# Patient Record
Sex: Male | Born: 1960 | Race: White | Hispanic: No | Marital: Married | Smoking: Never smoker
Health system: Southern US, Community
[De-identification: ages and names within clinical notes are randomized; demographics above are authoritative.]

## PROBLEM LIST (undated history)

## (undated) DIAGNOSIS — N289 Disorder of kidney and ureter, unspecified: Secondary | ICD-10-CM

## (undated) HISTORY — PX: WRIST SURGERY: SHX841

## (undated) HISTORY — PX: ANKLE SURGERY: SHX546

---

## 2004-02-18 ENCOUNTER — Emergency Department (HOSPITAL_COMMUNITY): Admission: EM | Admit: 2004-02-18 | Discharge: 2004-02-18 | Payer: Self-pay | Admitting: Emergency Medicine

## 2004-02-28 ENCOUNTER — Ambulatory Visit (HOSPITAL_COMMUNITY): Admission: RE | Admit: 2004-02-28 | Discharge: 2004-02-28 | Payer: Self-pay | Admitting: Orthopedic Surgery

## 2004-07-10 ENCOUNTER — Emergency Department (HOSPITAL_COMMUNITY): Admission: EM | Admit: 2004-07-10 | Discharge: 2004-07-10 | Payer: Self-pay | Admitting: Emergency Medicine

## 2004-07-19 ENCOUNTER — Ambulatory Visit (HOSPITAL_BASED_OUTPATIENT_CLINIC_OR_DEPARTMENT_OTHER): Admission: RE | Admit: 2004-07-19 | Discharge: 2004-07-19 | Payer: Self-pay | Admitting: Orthopedic Surgery

## 2004-07-30 ENCOUNTER — Encounter: Admission: RE | Admit: 2004-07-30 | Discharge: 2004-10-28 | Payer: Self-pay | Admitting: Orthopedic Surgery

## 2004-09-17 ENCOUNTER — Encounter: Admission: RE | Admit: 2004-09-17 | Discharge: 2004-09-17 | Payer: Self-pay | Admitting: Orthopedic Surgery

## 2010-07-15 ENCOUNTER — Encounter: Payer: Self-pay | Admitting: Orthopedic Surgery

## 2012-05-08 ENCOUNTER — Emergency Department (HOSPITAL_COMMUNITY)
Admission: EM | Admit: 2012-05-08 | Discharge: 2012-05-08 | Disposition: A | Discharge status: Home / Self Care | Payer: Self-pay | Attending: Emergency Medicine | Admitting: Emergency Medicine

## 2012-05-08 ENCOUNTER — Encounter (HOSPITAL_COMMUNITY): Payer: Self-pay | Admitting: *Deleted

## 2012-05-08 DIAGNOSIS — IMO0002 Reserved for concepts with insufficient information to code with codable children: Secondary | ICD-10-CM | POA: Insufficient documentation

## 2012-05-08 DIAGNOSIS — T148XXA Other injury of unspecified body region, initial encounter: Secondary | ICD-10-CM

## 2012-05-08 DIAGNOSIS — Y9389 Activity, other specified: Secondary | ICD-10-CM | POA: Insufficient documentation

## 2012-05-08 DIAGNOSIS — M549 Dorsalgia, unspecified: Secondary | ICD-10-CM | POA: Insufficient documentation

## 2012-05-08 DIAGNOSIS — M542 Cervicalgia: Secondary | ICD-10-CM | POA: Insufficient documentation

## 2012-05-08 DIAGNOSIS — Z79899 Other long term (current) drug therapy: Secondary | ICD-10-CM | POA: Insufficient documentation

## 2012-05-08 MED ORDER — IBUPROFEN 800 MG PO TABS: 800.0000 mg | ORAL_TABLET | Freq: Four times a day (QID) | ORAL | Status: DC | PRN

## 2012-05-08 MED ORDER — OXYCODONE-ACETAMINOPHEN 5-325 MG PO TABS: 1.0000 | ORAL_TABLET | Freq: Four times a day (QID) | ORAL | Status: DC | PRN

## 2012-05-08 MED ORDER — DIAZEPAM 5 MG PO TABS: 5.0000 mg | ORAL_TABLET | Freq: Four times a day (QID) | ORAL | Status: DC | PRN
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
## 2016-08-18 ENCOUNTER — Emergency Department (HOSPITAL_COMMUNITY): Payer: Self-pay

## 2016-08-18 ENCOUNTER — Encounter (HOSPITAL_COMMUNITY): Payer: Self-pay

## 2016-08-18 ENCOUNTER — Emergency Department (HOSPITAL_COMMUNITY)
Admission: EM | Admit: 2016-08-18 | Discharge: 2016-08-18 | Disposition: A | Discharge status: Home / Self Care | Payer: Self-pay | Attending: Emergency Medicine | Admitting: Emergency Medicine

## 2016-08-18 DIAGNOSIS — Z79899 Other long term (current) drug therapy: Secondary | ICD-10-CM | POA: Insufficient documentation

## 2016-08-18 DIAGNOSIS — J069 Acute upper respiratory infection, unspecified: Secondary | ICD-10-CM | POA: Insufficient documentation

## 2016-08-18 LAB — CBG MONITORING, ED: GLUCOSE-CAPILLARY: 108 mg/dL — AB (ref 65–99)

## 2016-08-18 MED ORDER — METHOCARBAMOL 500 MG PO TABS: 500.0000 mg | ORAL_TABLET | Freq: Four times a day (QID) | ORAL | 0 refills | Status: DC | PRN

## 2016-08-18 MED ORDER — FLUTICASONE PROPIONATE 50 MCG/ACT NA SUSP: 2.0000 | Freq: Every day | NASAL | 0 refills | Status: DC

## 2016-08-18 MED ORDER — ALBUTEROL SULFATE HFA 108 (90 BASE) MCG/ACT IN AERS
2.0000 | INHALATION_SPRAY | Freq: Once | RESPIRATORY_TRACT | Status: AC
  Administered 2016-08-18: 2 via RESPIRATORY_TRACT
  Filled 2016-08-18: qty 6.7

## 2016-08-18 NOTE — ED Notes (Signed)
Bed: WA19 Expected date:  Expected time:  Means of arrival:  Comments: 

## 2016-08-18 NOTE — ED Provider Notes (Signed)
[facility]-EMERGENCY DEPT Provider Note   CSN: [id number] Arrival date & time: 08/18/16  7829     History   Chief Complaint Chief Complaint  Patient presents with  . Cough  . Nasal Congestion    HPI Charles Werner is a 56 y.o. male.  HPI   Patient presents with 4 days of cough, occasional SOB, nasal congestion, facial fullness and pain.  He also has pain in his upper back with coughing or sneezing.  Associated fatigue.  The symptoms come and go, worse at night.  The symptoms are not exertional.  Denies any fevers, chills, myalgias, current facial pain, sore throat, chest pain.  Has had sick contact at work.  No flu shot this season.  No known medical problems but does not go to a doctor.    History reviewed. No pertinent past medical history.  There are no active problems to display for this patient.   Past Surgical History:  Procedure Laterality Date  . ANKLE SURGERY    . WRIST SURGERY         Home Medications    Prior to Admission medications   Medication Sig Start Date End Date Taking? Authorizing Provider  diazepam (VALIUM) 5 MG tablet Take 1 tablet (5 mg total) by mouth every 6 (six) hours as needed (spasm). 05/08/12   Benjiman Core, MD  fluticasone (FLONASE) 50 MCG/ACT nasal spray Place 2 sprays into both nostrils daily. 08/18/16   Trixie Dredge, PA-C  ibuprofen (ADVIL,MOTRIN) 200 MG tablet Take 600 mg by mouth every 6 (six) hours as needed. For pain    Historical Provider, MD  ibuprofen (ADVIL,MOTRIN) 800 MG tablet Take 1 tablet (800 mg total) by mouth every 6 (six) hours as needed for pain. 05/08/12   Benjiman Core, MD  methocarbamol (ROBAXIN) 500 MG tablet Take 1-2 tablets (500-1,000 mg total) by mouth every 6 (six) hours as needed for muscle spasms (and pain). 08/18/16   Trixie Dredge, PA-C  oxyCODONE-acetaminophen (PERCOCET/ROXICET) 5-325 MG per tablet Take 1-2 tablets by mouth every 6 (six) hours as needed for pain. 05/08/12   Benjiman Core, MD    Family  History History reviewed. No pertinent family history.  Social History Social History  Substance Use Topics  . Smoking status: Never Smoker  . Smokeless tobacco: Never Used  . Alcohol use No     Allergies   Patient has no known allergies.   Review of Systems Review of Systems  All other systems reviewed and are negative.    Physical Exam Updated Vital Signs BP 133/91 (BP Location: Left Arm)   Pulse 103   Temp 97.6 F (36.4 C) (Oral)   Resp 18   SpO2 97%   Physical Exam  Constitutional: He appears well-developed and well-nourished. No distress.  HENT:  Head: Normocephalic and atraumatic.  Nose: Rhinorrhea present. Right sinus exhibits no maxillary sinus tenderness and no frontal sinus tenderness. Left sinus exhibits no maxillary sinus tenderness and no frontal sinus tenderness.  Mouth/Throat: Uvula is midline, oropharynx is clear and moist and mucous membranes are normal. No oropharyngeal exudate, posterior oropharyngeal edema, posterior oropharyngeal erythema or tonsillar abscesses.  Eyes: Conjunctivae and EOM are normal. Right eye exhibits no discharge. Left eye exhibits no discharge.  Neck: Normal range of motion. Neck supple.  Cardiovascular: Normal rate and regular rhythm.   Pulmonary/Chest: Effort normal and breath sounds normal. No stridor. No respiratory distress. He has no wheezes. He has no rales.  Lymphadenopathy:    He has no cervical adenopathy.  Neurological: He is alert.  Skin: He is not diaphoretic.  Nursing note and vitals reviewed.    ED Treatments / Results  Labs (all labs ordered are listed, but only abnormal results are displayed) Labs Reviewed  CBG MONITORING, ED - Abnormal; Notable for the following:       Result Value   Glucose-Capillary 108 (*)    All other components within normal limits    EKG  EKG Interpretation None       Radiology Dg Chest 2 View  Result Date: 08/18/2016 CLINICAL DATA:  Cough congestion and sneezing for  3 days. EXAM: CHEST  2 VIEW COMPARISON:  02/28/2004 chest radiograph FINDINGS: The cardiomediastinal silhouette is unremarkable. Mild elevation of the right hemidiaphragm again noted. There is no evidence of focal airspace disease, pulmonary edema, suspicious pulmonary nodule/mass, pleural effusion, or pneumothorax. No acute bony abnormalities are identified. IMPRESSION: No active cardiopulmonary disease. Electronically Signed   By: Harmon PierJeffrey  Hu M.D.   On: 08/18/2016 09:43    Procedures Procedures (including critical care time)  Medications Ordered in ED Medications  albuterol (PROVENTIL HFA;VENTOLIN HFA) 108 (90 Base) MCG/ACT inhaler 2 puff (not administered)     Initial Impression / Assessment and Plan / ED Course  I have reviewed the triage vital signs and the nursing notes.  Pertinent labs & imaging results that were available during my care of the patient were reviewed by me and considered in my medical decision making (see chart for details).     Afebrile, nontoxic patient with constellation of symptoms suggestive of viral syndrome.  No concerning findings on exam. Doubt acute bacterial infection.   CXR negative.  CBG 108, pt advised to follow up with PCP and have rechecked.  Discharged home with supportive care, PCP follow up.  Discussed result, findings, treatment, and follow up  with patient.  Pt given return precautions.  Pt verbalizes understanding and agrees with plan.      Final Clinical Impressions(s) / ED Diagnoses   Final diagnoses:  Viral upper respiratory tract infection    New Prescriptions New Prescriptions   FLUTICASONE (FLONASE) 50 MCG/ACT NASAL SPRAY    Place 2 sprays into both nostrils daily.   METHOCARBAMOL (ROBAXIN) 500 MG TABLET    Take 1-2 tablets (500-1,000 mg total) by mouth every 6 (six) hours as needed for muscle spasms (and pain).     Trixie Dredgemily Truly Stankiewicz, PA-C 08/18/16 1049    Rolland PorterMark James, MD 08/30/16 [phone]

## 2016-08-18 NOTE — ED Triage Notes (Signed)
Pt states cough, congestion, headache, runny nose and sneezing since Thursday.  Denies fever or n/v

## 2016-08-18 NOTE — Discharge Instructions (Signed)
Read the information below.  Use the prescribed medication as directed.  Please discuss all new medications with your pharmacist.  You may return to the Emergency Department at any time for worsening condition or any new symptoms that concern you.  If you develop high fevers that do not resolve with tylenol or ibuprofen, you have difficulty swallowing or breathing, develop chest pain, or you are unable to tolerate fluids by mouth, return to the ER for a recheck.    °

## 2021-05-18 ENCOUNTER — Encounter (HOSPITAL_COMMUNITY): Payer: Self-pay | Admitting: Emergency Medicine

## 2021-05-18 ENCOUNTER — Emergency Department (HOSPITAL_COMMUNITY)
Admission: EM | Admit: 2021-05-18 | Discharge: 2021-05-19 | Disposition: A | Discharge status: Home / Self Care | Payer: Self-pay | Attending: Emergency Medicine | Admitting: Emergency Medicine

## 2021-05-18 ENCOUNTER — Emergency Department (HOSPITAL_COMMUNITY): Payer: Self-pay

## 2021-05-18 ENCOUNTER — Other Ambulatory Visit: Payer: Self-pay

## 2021-05-18 DIAGNOSIS — J101 Influenza due to other identified influenza virus with other respiratory manifestations: Secondary | ICD-10-CM | POA: Insufficient documentation

## 2021-05-18 DIAGNOSIS — Z20822 Contact with and (suspected) exposure to covid-19: Secondary | ICD-10-CM | POA: Insufficient documentation

## 2021-05-18 DIAGNOSIS — Z28310 Unvaccinated for covid-19: Secondary | ICD-10-CM | POA: Insufficient documentation

## 2021-05-18 DIAGNOSIS — R079 Chest pain, unspecified: Secondary | ICD-10-CM

## 2021-05-18 LAB — BASIC METABOLIC PANEL
Anion gap: 11 (ref 5–15)
BUN: 11 mg/dL (ref 6–20)
CO2: 23 mmol/L (ref 22–32)
Calcium: 9.3 mg/dL (ref 8.9–10.3)
Chloride: 104 mmol/L (ref 98–111)
Creatinine, Ser: 0.95 mg/dL (ref 0.61–1.24)
GFR, Estimated: >60 mL/min (ref >60)
Glucose, Bld: 122 mg/dL — ABNORMAL HIGH (ref 70–99)
Potassium: 4 mmol/L (ref 3.5–5.1)
Sodium: 138 mmol/L (ref 135–145)

## 2021-05-18 LAB — CBC WITH DIFFERENTIAL/PLATELET
Abs Immature Granulocytes: 0.04 K/uL (ref 0.00–0.07)
Basophils Absolute: 0.1 K/uL (ref 0.0–0.1)
Basophils Relative: 1 %
Eosinophils Absolute: 0.4 K/uL (ref 0.0–0.5)
Eosinophils Relative: 4 %
HCT: 48.9 % (ref 39.0–52.0)
Hemoglobin: 15.8 g/dL (ref 13.0–17.0)
Immature Granulocytes: 0 %
Lymphocytes Relative: 14 %
Lymphs Abs: 1.3 K/uL (ref 0.7–4.0)
MCH: 30.5 pg (ref 26.0–34.0)
MCHC: 32.3 g/dL (ref 30.0–36.0)
MCV: 94.4 fL (ref 80.0–100.0)
Monocytes Absolute: 1 K/uL (ref 0.1–1.0)
Monocytes Relative: 10 %
Neutro Abs: 6.9 K/uL (ref 1.7–7.7)
Neutrophils Relative %: 71 %
Platelets: 188 K/uL (ref 150–400)
RBC: 5.18 MIL/uL (ref 4.22–5.81)
RDW: 13.1 % (ref 11.5–15.5)
WBC: 9.6 K/uL (ref 4.0–10.5)
nRBC: 0 % (ref 0.0–0.2)

## 2021-05-18 LAB — TROPONIN I (HIGH SENSITIVITY)
Troponin I (High Sensitivity): 3 ng/L (ref <18)
Troponin I (High Sensitivity): 3 ng/L

## 2021-05-18 LAB — RESP PANEL BY RT-PCR (FLU A&B, COVID) ARPGX2
Influenza A by PCR: POSITIVE — AB
Influenza B by PCR: NEGATIVE
SARS Coronavirus 2 by RT PCR: NEGATIVE

## 2021-05-18 MED ORDER — ONDANSETRON 4 MG PO TBDP
4.0000 mg | ORAL_TABLET | Freq: Once | ORAL | Status: AC
  Administered 2021-05-18: 4 mg via ORAL
  Filled 2021-05-18: qty 1

## 2021-05-18 MED ORDER — OSELTAMIVIR PHOSPHATE 75 MG PO CAPS
75.0000 mg | ORAL_CAPSULE | Freq: Once | ORAL | Status: AC
  Administered 2021-05-18: 75 mg via ORAL
  Filled 2021-05-18: qty 1

## 2021-05-18 MED ORDER — KETOROLAC TROMETHAMINE 30 MG/ML IJ SOLN
30.0000 mg | Freq: Once | INTRAMUSCULAR | Status: AC
  Administered 2021-05-18: 30 mg via INTRAVENOUS
  Filled 2021-05-18: qty 1

## 2021-05-18 NOTE — Discharge Instructions (Signed)
You were seen in the emergency department for evaluation of sore throat cough chest pain headache shortness of breath.  You tested positive for flu.  You had a chest x-ray lab work and EKG that otherwise did not show any significant abnormalities.  We are prescribing you Tamiflu which may help shorten the symptoms appear flu.  Your COVID test was negative.  You can use Tylenol and ibuprofen for pain and fever.  Drink plenty of fluids.  Rest.  Follow-up with your doctor.  Return to the emergency department if any worsening or concerning symptoms

## 2021-05-18 NOTE — ED Triage Notes (Signed)
Pt c/o chest discomfort with back discomfort since this am, reports decreased appetite with n/v

## 2021-05-18 NOTE — ED Notes (Signed)
While about to draw blood patient had emesis x2. PA Britni made aware

## 2021-05-18 NOTE — ED Provider Notes (Signed)
[facility] DEPT Provider Note   CSN: [id number] Arrival date & time: 05/18/21  2018     History No chief complaint on file.   Charles Werner is a 60 y.o. male.  He is here with a complaint of nasal congestion chest congestion chest discomfort into his back, scratchy sore throat, chills that started yesterday.  Cough is nonproductive.  Symptoms are worse with activity.  Has tried nothing for it.  He is not flu or COVID vaccinated.  Had COVID twice this year.  The history is provided by the patient.  Influenza Presenting symptoms: cough, headache, rhinorrhea, shortness of breath and sore throat   Presenting symptoms: no diarrhea, no fever, no nausea and no vomiting   Shortness of breath:    Severity:  Moderate   Onset quality:  Gradual   Duration:  3 days   Timing:  Intermittent   Progression:  Unchanged Severity:  Mild Onset quality:  Gradual Duration:  2 days Progression:  Unchanged Relieved by:  None tried Worsened by:  Nothing Ineffective treatments:  None tried Associated symptoms: chills and nasal congestion   Associated symptoms: no mental status change   Chest Pain Pain location:  L chest and R chest Pain quality: pressure   Pain radiates to:  Upper back Pain severity:  Moderate Onset quality:  Gradual Duration:  2 days Timing:  Intermittent Progression:  Unchanged Chronicity:  New Relieved by:  None tried Worsened by:  Certain positions Ineffective treatments:  None tried Associated symptoms: cough, headache and shortness of breath   Associated symptoms: no abdominal pain, no diaphoresis, no fever, no nausea and no vomiting   Risk factors: no smoking       No past medical history on file.  There are no problems to display for this patient.   Past Surgical History:  Procedure Laterality Date   ANKLE SURGERY     WRIST SURGERY         No family history on file.  Social History   Tobacco Use   Smoking status:  Never   Smokeless tobacco: Never  Substance Use Topics   Alcohol use: No   Drug use: No    Home Medications Prior to Admission medications   Medication Sig Start Date End Date Taking? Authorizing Provider  diazepam (VALIUM) 5 MG tablet Take 1 tablet (5 mg total) by mouth every 6 (six) hours as needed (spasm). 05/08/12   Davonna Belling, MD  fluticasone (FLONASE) 50 MCG/ACT nasal spray Place 2 sprays into both nostrils daily. 08/18/16   Clayton Bibles, PA-C  ibuprofen (ADVIL,MOTRIN) 200 MG tablet Take 600 mg by mouth every 6 (six) hours as needed. For pain    [provider]  ibuprofen (ADVIL,MOTRIN) 800 MG tablet Take 1 tablet (800 mg total) by mouth every 6 (six) hours as needed for pain. 05/08/12   Davonna Belling, MD  methocarbamol (ROBAXIN) 500 MG tablet Take 1-2 tablets (500-1,000 mg total) by mouth every 6 (six) hours as needed for muscle spasms (and pain). 08/18/16   Clayton Bibles, PA-C  oxyCODONE-acetaminophen (PERCOCET/ROXICET) 5-325 MG per tablet Take 1-2 tablets by mouth every 6 (six) hours as needed for pain. 05/08/12   Davonna Belling, MD    Allergies    Patient has no known allergies.  Review of Systems   Review of Systems  Constitutional:  Positive for chills. Negative for diaphoresis and fever.  HENT:  Positive for congestion, rhinorrhea and sore throat.   Eyes:  Negative for  visual disturbance.  Respiratory:  Positive for cough and shortness of breath.   Cardiovascular:  Positive for chest pain.  Gastrointestinal:  Negative for abdominal pain, diarrhea, nausea and vomiting.  Genitourinary:  Negative for dysuria.  Musculoskeletal:  Negative for neck pain.  Skin:  Negative for rash.  Neurological:  Positive for headaches.   Physical Exam Updated Vital Signs BP (!) 154/96 (BP Location: Left Arm)   Pulse (!) 101   Temp 98.7 F (37.1 C) (Oral)   Resp 18   SpO2 94%   Physical Exam Vitals and nursing note reviewed.  Constitutional:      General: He is  not in acute distress.    Appearance: Normal appearance. He is well-developed.  HENT:     Head: Normocephalic and atraumatic.     Mouth/Throat:     Mouth: Mucous membranes are moist.     Pharynx: Oropharynx is clear.  Eyes:     Conjunctiva/sclera: Conjunctivae normal.  Cardiovascular:     Rate and Rhythm: Regular rhythm. Tachycardia present.     Heart sounds: No murmur heard. Pulmonary:     Effort: Pulmonary effort is normal. No respiratory distress.     Breath sounds: Normal breath sounds.  Abdominal:     Palpations: Abdomen is soft.     Tenderness: There is no abdominal tenderness. There is no guarding or rebound.  Musculoskeletal:        General: No swelling.     Cervical back: Neck supple.     Right lower leg: No edema.     Left lower leg: No edema.  Skin:    General: Skin is warm and dry.     Capillary Refill: Capillary refill takes less than 2 seconds.  Neurological:     General: No focal deficit present.     Mental Status: He is alert.     Sensory: No sensory deficit.     Motor: No weakness.  Psychiatric:        Mood and Affect: Mood normal.    ED Results / Procedures / Treatments   Labs (all labs ordered are listed, but only abnormal results are displayed) Labs Reviewed  RESP PANEL BY RT-PCR (FLU A&B, COVID) ARPGX2 - Abnormal; Notable for the following components:      Result Value   Influenza A by PCR POSITIVE (*)    All other components within normal limits  BASIC METABOLIC PANEL - Abnormal; Notable for the following components:   Glucose, Bld 122 (*)    All other components within normal limits  CBC WITH DIFFERENTIAL/PLATELET  TROPONIN I (HIGH SENSITIVITY)  TROPONIN I (HIGH SENSITIVITY)    EKG EKG Interpretation  Date/Time:  Friday May 18 2021 20:34:32 EST Ventricular Rate:  108 PR Interval:  132 QRS Duration: 94 QT Interval:  324 QTC Calculation: 435 R Axis:   50 Text Interpretation: Sinus tachycardia Borderline repolarization abnormality  Nonspecific ST and T wave abnormality Confirmed by Aletta Edouard [phone]) on 05/18/2021 8:51:56 PM  Radiology DG Chest Port 1 View  Result Date: 05/18/2021 CLINICAL DATA:  Cough, shortness of breath. EXAM: PORTABLE CHEST 1 VIEW COMPARISON:  08/18/2016. FINDINGS: The heart size and mediastinal contours are within normal limits. Both lungs are clear. The visualized skeletal structures are unremarkable. IMPRESSION: No acute cardiopulmonary process. Electronically Signed   By: Brett Fairy M.D.   On: 05/18/2021 21:31    Procedures Procedures   Medications Ordered in ED Medications  ondansetron (ZOFRAN-ODT) disintegrating tablet 4 mg (4 mg Oral  Given 05/18/21 2050)  ketorolac (TORADOL) 30 MG/ML injection 30 mg (30 mg Intravenous Given 05/18/21 2208)  oseltamivir (TAMIFLU) capsule 75 mg (75 mg Oral Given 05/18/21 2314)    ED Course  I have reviewed the triage vital signs and the nursing notes.  Pertinent labs & imaging results that were available during my care of the patient were reviewed by me and considered in my medical decision making (see chart for details).  Clinical Course as of 05/19/21 1025  Fri May 18, 2021  2128 Chest x-ray interpreted by me as no acute infiltrates.  Awaiting radiology reading. [MB]    Clinical Course User Index [MB] Terrilee Files, MD   MDM Rules/Calculators/A&P                          Jaevon Paras was evaluated in Emergency Department on 05/18/2021 for the symptoms described in the history of present illness. He was evaluated in the context of the global COVID-19 pandemic, which necessitated consideration that the patient might be at risk for infection with the SARS-CoV-2 virus that causes COVID-19. Institutional protocols and algorithms that pertain to the evaluation of patients at risk for COVID-19 are in a state of rapid change based on information released by regulatory bodies including the CDC and federal and state organizations. These policies  and algorithms were followed during the patient's care in the ED.  This patient complains of cough shortness of breath chest and back pain, headache; this involves an extensive number of treatment Options and is a complaint that carries with it a high risk of complications and Morbidity. The differential includes COVID, flu, pneumonia, ACS, PE, pneumonia pneumothorax  I ordered, reviewed and interpreted labs, which included CBC with normal white count normal hemoglobin, chemistries normal other than mildly elevated glucose, troponins flat, COVID-negative flu positive I ordered medication Toradol, Zofran, Tamiflu I ordered imaging studies which included chest x-ray and I independently    visualized and interpreted imaging which showed acute findings Previous records obtained and reviewed in epic no recent admissions  After the interventions stated above, I reevaluated the patient and found patient to be hemodynamically stable slightly hypertensive.  He is comfortable plan for outpatient management of his symptoms.  Return instructions discussed   Final Clinical Impression(s) / ED Diagnoses Final diagnoses:  Influenza A  Nonspecific chest pain    Rx / DC Orders ED Discharge Orders          Ordered    oseltamivir (TAMIFLU) 75 MG capsule  Every 12 hours        05/19/21 0014             Terrilee Files, MD 05/19/21 1027

## 2021-05-18 NOTE — ED Provider Notes (Signed)
Emergency Medicine Provider Triage Evaluation Note  Charles Werner , a 60 y.o. male  was evaluated in triage.  Pt complains of congestion, chest pain.  Noted yesterday had a scratchy throat.  Today he developed some substernal chest pain.  Does not radiate to left arm, left back.  No lower extremity swelling.  No history of PE or DVT.  No history of aneurysm.  Does have some mild cough which is nonproductive.  Review of Systems  Positive: Chest pain, scratchy throat Negative: Lower extremity edema  Physical Exam  BP (!) 154/96 (BP Location: Left Arm)   Pulse (!) 101   Temp 98.7 F (37.1 C) (Oral)   Resp 18   SpO2 94%  Gen:   Awake, no distress   Resp:  Normal effort  MSK:   Moves extremities without difficulty  Other:    Medical Decision Making  Medically screening exam initiated at 8:39 PM.  Appropriate orders placed.  Charles Werner was informed that the remainder of the evaluation will be completed by another provider, this initial triage assessment does not replace that evaluation, and the importance of remaining in the ED until their evaluation is complete.  Chest pain, sore throat   Maguire Killmer A, PA-C 05/18/21 2040    Terrilee Files, MD 05/19/21 1027

## 2021-05-19 ENCOUNTER — Telehealth (HOSPITAL_COMMUNITY): Payer: Self-pay | Admitting: Physician Assistant

## 2021-05-19 MED ORDER — OSELTAMIVIR PHOSPHATE 75 MG PO CAPS: 75.0000 mg | ORAL_CAPSULE | Freq: Two times a day (BID) | ORAL | 0 refills | Status: DC

## 2021-05-19 MED ORDER — OSELTAMIVIR PHOSPHATE 75 MG PO CAPS: 75.0000 mg | ORAL_CAPSULE | Freq: Two times a day (BID) | ORAL | 0 refills | Status: AC

## 2021-05-19 NOTE — Telephone Encounter (Signed)
Patient seen yesterday in ED by Dr. Charm Barges.  Diagnosed with Influenza A.  Tamiflu RX did not crossover to pharmacy.  Patient called today to resend in medication.  Per patient prefers [facility] [address] [place].   Rx sent.

## 2021-09-05 ENCOUNTER — Encounter (HOSPITAL_COMMUNITY): Payer: Self-pay

## 2021-09-05 ENCOUNTER — Emergency Department (HOSPITAL_COMMUNITY)
Admission: EM | Admit: 2021-09-05 | Discharge: 2021-09-05 | Disposition: A | Discharge status: Home / Self Care | Payer: Self-pay | Attending: Emergency Medicine | Admitting: Emergency Medicine

## 2021-09-05 ENCOUNTER — Emergency Department (HOSPITAL_COMMUNITY): Payer: Self-pay

## 2021-09-05 DIAGNOSIS — N132 Hydronephrosis with renal and ureteral calculous obstruction: Secondary | ICD-10-CM | POA: Insufficient documentation

## 2021-09-05 DIAGNOSIS — N200 Calculus of kidney: Secondary | ICD-10-CM

## 2021-09-05 LAB — URINALYSIS, ROUTINE W REFLEX MICROSCOPIC
Bilirubin Urine: NEGATIVE
Glucose, UA: NEGATIVE mg/dL
Ketones, ur: 15 mg/dL — AB
Leukocytes,Ua: NEGATIVE
Nitrite: NEGATIVE
Specific Gravity, Urine: 1.025 (ref 1.005–1.030)
pH: 6.5 (ref 5.0–8.0)

## 2021-09-05 LAB — URINALYSIS, MICROSCOPIC (REFLEX)

## 2021-09-05 MED ORDER — HYDROMORPHONE HCL 2 MG/ML IJ SOLN
2.0000 mg | Freq: Once | INTRAMUSCULAR | Status: AC
  Administered 2021-09-05: 2 mg via INTRAMUSCULAR
  Filled 2021-09-05: qty 1

## 2021-09-05 MED ORDER — ONDANSETRON 8 MG PO TBDP
8.0000 mg | ORAL_TABLET | Freq: Three times a day (TID) | ORAL | 0 refills | Status: AC | PRN
Start: 2021-09-05 — End: ?

## 2021-09-05 MED ORDER — ONDANSETRON 8 MG PO TBDP
8.0000 mg | ORAL_TABLET | Freq: Once | ORAL | Status: AC
  Administered 2021-09-05: 8 mg via ORAL
  Filled 2021-09-05: qty 1

## 2021-09-05 MED ORDER — HYDROMORPHONE HCL 1 MG/ML IJ SOLN
1.0000 mg | Freq: Once | INTRAMUSCULAR | Status: AC
  Administered 2021-09-05: 1 mg via INTRAMUSCULAR
  Filled 2021-09-05: qty 1

## 2021-09-05 MED ORDER — KETOROLAC TROMETHAMINE 60 MG/2ML IM SOLN
30.0000 mg | Freq: Once | INTRAMUSCULAR | Status: AC
  Administered 2021-09-05: 30 mg via INTRAMUSCULAR
  Filled 2021-09-05: qty 2

## 2021-09-05 MED ORDER — OXYCODONE-ACETAMINOPHEN 5-325 MG PO TABS: 1.0000 | ORAL_TABLET | Freq: Four times a day (QID) | ORAL | 0 refills | Status: AC | PRN

## 2021-09-05 NOTE — ED Provider Notes (Signed)
?[facility]-EMERGENCY DEPT ?Provider Note ? ? ?CSN: [id number] ?Arrival date & time: 09/05/21  1194 ? ?  ? ?History ? ?Chief Complaint  ?Patient presents with  ? Flank Pain  ? ? ?Charles Werner is a 61 y.o. male. ? ?61 year old male presents with cute onset of left-sided flank pain rating to left groin.  Has noted dark urine.  Denies any prior history of renal colic.  No fever or chills.  No testicular pain or swelling.  No treatment use prior to arrival ? ? ?  ? ?Home Medications ?Prior to Admission medications   ?Medication Sig Start Date End Date Taking? Authorizing Provider  ?oseltamivir (TAMIFLU) 75 MG capsule Take 1 capsule (75 mg total) by mouth every 12 (twelve) hours. 05/19/21   Henderly, Britni A, PA-C  ?   ? ?Allergies    ?Patient has no known allergies.   ? ?Review of Systems   ?Review of Systems  ?All other systems reviewed and are negative. ? ?Physical Exam ?Updated Vital Signs ?BP (!) 145/80 (BP Location: Left Arm)   Pulse 84   Temp (!) 97.4 ?F (36.3 ?C) (Oral)   Resp 18   SpO2 96%  ?Physical Exam ?Vitals and nursing note reviewed.  ?Constitutional:   ?   General: He is not in acute distress. ?   Appearance: Normal appearance. He is well-developed. He is not toxic-appearing.  ?HENT:  ?   Head: Normocephalic and atraumatic.  ?Eyes:  ?   General: Lids are normal.  ?   Conjunctiva/sclera: Conjunctivae normal.  ?   Pupils: Pupils are equal, round, and reactive to light.  ?Neck:  ?   Thyroid: No thyroid mass.  ?   Trachea: No tracheal deviation.  ?Cardiovascular:  ?   Rate and Rhythm: Normal rate and regular rhythm.  ?   Heart sounds: Normal heart sounds. No murmur heard. ?  No gallop.  ?Pulmonary:  ?   Effort: Pulmonary effort is normal. No respiratory distress.  ?   Breath sounds: Normal breath sounds. No stridor. No decreased breath sounds, wheezing, rhonchi or rales.  ?Abdominal:  ?   General: There is no distension.  ?   Palpations: Abdomen is soft.  ?   Tenderness: There is no  abdominal tenderness. There is left CVA tenderness. There is no rebound.  ?Musculoskeletal:     ?   General: No tenderness. Normal range of motion.  ?   Cervical back: Normal range of motion and neck supple.  ?Skin: ?   General: Skin is warm and dry.  ?   Findings: No abrasion or rash.  ?Neurological:  ?   Mental Status: He is alert and oriented to person, place, and time. Mental status is at baseline.  ?   GCS: GCS eye subscore is 4. GCS verbal subscore is 5. GCS motor subscore is 6.  ?   Cranial Nerves: No cranial nerve deficit.  ?   Sensory: No sensory deficit.  ?   Motor: Motor function is intact.  ?Psychiatric:     ?   Attention and Perception: Attention normal.     ?   Speech: Speech normal.     ?   Behavior: Behavior normal.  ? ? ?ED Results / Procedures / Treatments   ?Labs ?(all labs ordered are listed, but only abnormal results are displayed) ?Labs Reviewed  ?URINALYSIS, ROUTINE W REFLEX MICROSCOPIC  ? ? ?EKG ?None ? ?Radiology ?No results found. ? ?Procedures ?Procedures  ? ? ?Medications  Ordered in ED ?Medications  ?HYDROmorphone (DILAUDID) injection 2 mg (has no administration in time range)  ? ? ?ED Course/ Medical Decision Making/ A&P ?  ?                        ?Medical Decision Making ?Amount and/or Complexity of Data Reviewed ?Labs: ordered. ?Radiology: ordered. ? ?Risk ?Prescription drug management. ? ? ?Patient presented with cute onset of left-sided flank pain concern for renal colic.  Patient's CAT scan does show a 4 mm left-sided kidney stone.  Patient images were reviewed and interpreted by me.  He did require multiple doses of IM medication for pain management.  He does will be at this time for urinalysis without infection.  Patient will be discharged home and given referral to neurology on-call ? ? ? ? ? ? ? ?Final Clinical Impression(s) / ED Diagnoses ?Final diagnoses:  ?None  ? ? ?Rx / DC Orders ?ED Discharge Orders   ? ? None  ? ?  ? ? ?  ?Lorre Nick, MD ?09/05/21 1501 ? ?

## 2021-09-05 NOTE — ED Notes (Signed)
Informed pt we need a urine and if we don't get one we will have to do a cathter pt stated he will try on his own ?

## 2021-09-05 NOTE — ED Triage Notes (Signed)
Pt arrived via POV, c/o left flank and LLQ abd pain. Also endorses some nausea. Denies any vomiting, diarrhea or urinary issues.  ?

## 2021-11-25 ENCOUNTER — Emergency Department (HOSPITAL_COMMUNITY)
Admission: EM | Admit: 2021-11-25 | Discharge: 2021-11-25 | Disposition: A | Discharge status: Home / Self Care | Payer: Self-pay | Attending: Emergency Medicine | Admitting: Emergency Medicine

## 2021-11-25 ENCOUNTER — Encounter (HOSPITAL_COMMUNITY): Payer: Self-pay

## 2021-11-25 ENCOUNTER — Other Ambulatory Visit: Payer: Self-pay

## 2021-11-25 DIAGNOSIS — Z87442 Personal history of urinary calculi: Secondary | ICD-10-CM | POA: Insufficient documentation

## 2021-11-25 DIAGNOSIS — N23 Unspecified renal colic: Secondary | ICD-10-CM | POA: Insufficient documentation

## 2021-11-25 HISTORY — DX: Disorder of kidney and ureter, unspecified: N28.9

## 2021-11-25 NOTE — Discharge Instructions (Addendum)
If you develop fever, vomiting, new or worsening pain, persistent pain that does not go away, weakness or numbness in your legs, difficulty urinating, or any other new/concerning symptoms or return to the ER for evaluation.  If your pain does not seem to go away after several days or a week, then see the urologist as an outpatient.

## 2021-11-25 NOTE — ED Provider Notes (Signed)
[facility]-EMERGENCY DEPT Provider Note   CSN: [id number] Arrival date & time: 11/25/21  1126     History  Chief Complaint  Patient presents with   Flank Pain   Abdominal Pain    Charles Werner is a 61 y.o. male.  HPI 61 year old male presents with right sided flank and abdominal pain that seems consistent with previous history of kidney stones.  He states that he had a little bit of pain yesterday after heavy lifting.  However this morning he developed severe pain.  He states it felt just like when he had a kidney stone on the left side several weeks ago and was seen in this emergency department.  Finally he took a narcotic that he had been given last time but never ended up taking.  By the time he got here after he was encouraged to come here by family, his pain has resolved.  Since being in the waiting room for a couple hours he has not developed any recurrent pain.  He did vomit once earlier.  No fevers or dysuria.  No hematuria.  Currently has no pain.  No radicular pain down his legs or weakness/numbness.  Home Medications Prior to Admission medications   Medication Sig Start Date End Date Taking? Authorizing Provider  ondansetron (ZOFRAN-ODT) 8 MG disintegrating tablet Take 1 tablet (8 mg total) by mouth every 8 (eight) hours as needed for nausea or vomiting. 09/05/21   Lorre Nick, MD  oseltamivir (TAMIFLU) 75 MG capsule Take 1 capsule (75 mg total) by mouth every 12 (twelve) hours. 05/19/21   Henderly, Britni A, PA-C  oxyCODONE-acetaminophen (PERCOCET/ROXICET) 5-325 MG tablet Take 1-2 tablets by mouth every 6 (six) hours as needed for severe pain. 09/05/21   Lorre Nick, MD      Allergies    Patient has no known allergies.    Review of Systems   Review of Systems  Constitutional:  Negative for fever.  Gastrointestinal:  Positive for abdominal pain, nausea and vomiting.  Genitourinary:  Negative for dysuria and hematuria.  Musculoskeletal:   Positive for back pain.  Neurological:  Negative for weakness and numbness.   Physical Exam Updated Vital Signs BP 136/86 (BP Location: Left Arm)   Pulse 63   Temp 98.3 F (36.8 C) (Oral)   Resp 18   Ht 6\' 5"  (1.956 m)   Wt (!) 147.4 kg   SpO2 97%   BMI 38.54 kg/m  Physical Exam Vitals and nursing note reviewed.  Constitutional:      Appearance: He is well-developed. He is obese.  HENT:     Head: Normocephalic and atraumatic.  Cardiovascular:     Rate and Rhythm: Normal rate and regular rhythm.     Heart sounds: Normal heart sounds.  Pulmonary:     Effort: Pulmonary effort is normal.     Breath sounds: Normal breath sounds.  Abdominal:     Palpations: Abdomen is soft.     Tenderness: There is no abdominal tenderness. There is no right CVA tenderness or left CVA tenderness.  Skin:    General: Skin is warm and dry.  Neurological:     Mental Status: He is alert.    ED Results / Procedures / Treatments   Labs (all labs ordered are listed, but only abnormal results are displayed) Labs Reviewed - No data to display  EKG None  Radiology No results found.  Procedures Procedures    Medications Ordered in ED Medications - No data to  display  ED Course/ Medical Decision Making/ A&P                           Medical Decision Making Amount and/or Complexity of Data Reviewed External Data Reviewed: radiology and notes.   Patient is well-appearing here.  Vital signs are normal.  I reviewed his chart and on 3/15 he had a left ureteral stone.  He states the brain today is very similar to what it was at that time.  However it is gone and was controlled by his oral pain medicine.  Due to this he prefers not to get any labs or a repeat CT.  I think this is pretty reasonable.  While his pain was also in the right lower quadrant, given that it has completely gone and felt just like his previous stone, and has no reproducible tenderness now, I think something such as  appendicitis is pretty unlikely.  I think is reasonable to defer CT imaging and labs and we have agreed to defer these now but also stressed that if he develops new or recurrent symptoms he needs to return to the ER.  Otherwise will discharge to follow-up with urology as needed if his symptoms do not improve.        Final Clinical Impression(s) / ED Diagnoses Final diagnoses:  Ureteral colic    Rx / DC Orders ED Discharge Orders     None         Pricilla Loveless, MD 11/25/21 1352

## 2021-11-25 NOTE — ED Triage Notes (Addendum)
Patient reports a known kidney stone. Patient states he has right lower abdominal pain that radiates into the right flank.  Patient states  he vomited x 1 today   Patient states he took a pain pill prior to coming to the ED. Patient denies any urinary issues or hematuria.

## 2021-11-25 NOTE — ED Provider Triage Note (Signed)
Emergency Medicine Provider Triage Evaluation Note  Charles Werner , a 61 y.o. male  was evaluated in triage.  Pt complains of right flank pain of 2-day duration associated with dysuria.  Denies fever, chills.  History of kidney stones.  He wants to defer CT renal if at all possible.  Would like to start with blood work and urine and discuss CT scan when he gets a room.  Currently without pain. Review of Systems  Positive: As above Negative:  as above  Physical Exam  BP 136/86 (BP Location: Left Arm)   Pulse 63   Temp 98.3 F (36.8 C) (Oral)   Resp 18   Ht 6\' 5"  (1.956 m)   Wt (!) 147.4 kg   SpO2 97%   BMI 38.54 kg/m  Gen:   Awake, no distress   Resp:  Normal effort  MSK:   Moves extremities without difficulty  Other:    Medical Decision Making  Medically screening exam initiated at 12:40 PM.  Appropriate orders placed.  Charles Werner was informed that the remainder of the evaluation will be completed by another provider, this initial triage assessment does not replace that evaluation, and the importance of remaining in the ED until their evaluation is complete.     Evlyn Courier, PA-C 11/25/21 1241

## 2021-11-26 ENCOUNTER — Emergency Department (HOSPITAL_BASED_OUTPATIENT_CLINIC_OR_DEPARTMENT_OTHER)
Admission: EM | Admit: 2021-11-26 | Discharge: 2021-11-26 | Disposition: A | Discharge status: Home / Self Care | Payer: Self-pay | Attending: Emergency Medicine | Admitting: Emergency Medicine

## 2021-11-26 ENCOUNTER — Other Ambulatory Visit: Payer: Self-pay

## 2021-11-26 ENCOUNTER — Encounter (HOSPITAL_BASED_OUTPATIENT_CLINIC_OR_DEPARTMENT_OTHER): Payer: Self-pay | Admitting: Emergency Medicine

## 2021-11-26 ENCOUNTER — Emergency Department (HOSPITAL_BASED_OUTPATIENT_CLINIC_OR_DEPARTMENT_OTHER): Payer: Self-pay

## 2021-11-26 ENCOUNTER — Other Ambulatory Visit (HOSPITAL_BASED_OUTPATIENT_CLINIC_OR_DEPARTMENT_OTHER): Payer: Self-pay

## 2021-11-26 DIAGNOSIS — D72829 Elevated white blood cell count, unspecified: Secondary | ICD-10-CM | POA: Insufficient documentation

## 2021-11-26 DIAGNOSIS — N200 Calculus of kidney: Secondary | ICD-10-CM

## 2021-11-26 DIAGNOSIS — N179 Acute kidney failure, unspecified: Secondary | ICD-10-CM | POA: Insufficient documentation

## 2021-11-26 DIAGNOSIS — N132 Hydronephrosis with renal and ureteral calculous obstruction: Secondary | ICD-10-CM | POA: Insufficient documentation

## 2021-11-26 LAB — CBC
HCT: 47.5 % (ref 39.0–52.0)
Hemoglobin: 15.5 g/dL (ref 13.0–17.0)
MCH: 29.9 pg (ref 26.0–34.0)
MCHC: 32.6 g/dL (ref 30.0–36.0)
MCV: 91.7 fL (ref 80.0–100.0)
Platelets: 200 10*3/uL (ref 150–400)
RBC: 5.18 MIL/uL (ref 4.22–5.81)
RDW: 12.9 % (ref 11.5–15.5)
WBC: 13.6 10*3/uL — ABNORMAL HIGH (ref 4.0–10.5)
nRBC: 0 % (ref 0.0–0.2)

## 2021-11-26 LAB — URINALYSIS, ROUTINE W REFLEX MICROSCOPIC
Bilirubin Urine: NEGATIVE
Glucose, UA: NEGATIVE mg/dL
Ketones, ur: 15 mg/dL — AB
Leukocytes,Ua: NEGATIVE
Nitrite: NEGATIVE
Protein, ur: 30 mg/dL — AB
Specific Gravity, Urine: 1.029 (ref 1.005–1.030)
pH: 6 (ref 5.0–8.0)

## 2021-11-26 LAB — BASIC METABOLIC PANEL
Anion gap: 12 (ref 5–15)
BUN: 17 mg/dL (ref 6–20)
CO2: 21 mmol/L — ABNORMAL LOW (ref 22–32)
Calcium: 10 mg/dL (ref 8.9–10.3)
Chloride: 106 mmol/L (ref 98–111)
Creatinine, Ser: 1.45 mg/dL — ABNORMAL HIGH (ref 0.61–1.24)
GFR, Estimated: 55 mL/min — ABNORMAL LOW (ref >60)
Glucose, Bld: 139 mg/dL — ABNORMAL HIGH (ref 70–99)
Potassium: 4.5 mmol/L (ref 3.5–5.1)
Sodium: 139 mmol/L (ref 135–145)

## 2021-11-26 LAB — LIPASE, BLOOD: Lipase: 19 U/L (ref 11–51)

## 2021-11-26 MED ORDER — ONDANSETRON HCL 4 MG/2ML IJ SOLN
4.0000 mg | Freq: Once | INTRAMUSCULAR | Status: AC
  Administered 2021-11-26: 4 mg via INTRAVENOUS

## 2021-11-26 MED ORDER — IBUPROFEN 800 MG PO TABS
800.0000 mg | ORAL_TABLET | Freq: Three times a day (TID) | ORAL | 0 refills | Status: AC
  Filled 2021-11-26: qty 21, 7d supply, fill #0

## 2021-11-26 MED ORDER — HYDROCODONE-ACETAMINOPHEN 5-325 MG PO TABS
2.0000 | ORAL_TABLET | ORAL | 0 refills | Status: AC | PRN
  Filled 2021-11-26: qty 10, 1d supply, fill #0

## 2021-11-26 MED ORDER — FENTANYL CITRATE PF 50 MCG/ML IJ SOSY
50.0000 ug | PREFILLED_SYRINGE | INTRAMUSCULAR | Status: AC | PRN
  Administered 2021-11-26 (×2): 50 ug via INTRAVENOUS
  Filled 2021-11-26 (×2): qty 1

## 2021-11-26 MED ORDER — LACTATED RINGERS IV BOLUS
1000.0000 mL | Freq: Once | INTRAVENOUS | Status: AC
  Administered 2021-11-26: 1000 mL via INTRAVENOUS

## 2021-11-26 MED ORDER — KETOROLAC TROMETHAMINE 15 MG/ML IJ SOLN
15.0000 mg | Freq: Once | INTRAMUSCULAR | Status: AC
Start: 2021-11-26 — End: 2021-11-26
  Administered 2021-11-26: 15 mg via INTRAVENOUS
  Filled 2021-11-26: qty 1

## 2021-11-26 MED ORDER — TAMSULOSIN HCL 0.4 MG PO CAPS
0.4000 mg | ORAL_CAPSULE | Freq: Every day | ORAL | 0 refills | Status: AC
  Filled 2021-11-26: qty 5, 5d supply, fill #0

## 2021-11-26 MED ORDER — ONDANSETRON HCL 4 MG/2ML IJ SOLN
INTRAMUSCULAR | Status: AC
  Filled 2021-11-26: qty 2

## 2021-11-26 NOTE — Discharge Instructions (Addendum)
Please take 600 mg of Advil every 8 hrs for pain in addition to Flomax. Follow-up outpatient with Urology

## 2021-11-26 NOTE — ED Provider Notes (Signed)
[facility]   CSN: [id number] Arrival date & time: 11/26/21  1443     History  Chief Complaint  Patient presents with   Flank Pain    Charles Werner is a 61 y.o. male.   Flank Pain   61 year old male with a history of kidney stones presenting to the emergency department with right-sided flank and right lower quadrant abdominal pain.  The patient was seen in the emergency department yesterday for the same.  He was initially discharged after feeling symptomatically improved but presents with return of his pain.  He endorses no chills or fever.  He was somewhat diaphoretic.  He states that he was rolling around on the ground earlier today for around 2 hours before he made the decision to come to the emergency department.  He has not yet followed up outpatient with urology.  He has not taken anything for the pain.  On my initial evaluation, the patient symptomatically had complete resolution of his pain after Toradol.  Home Medications Prior to Admission medications   Medication Sig Start Date End Date Taking? Authorizing Provider  HYDROcodone-acetaminophen (NORCO/VICODIN) 5-325 MG tablet Take 2 tablets by mouth every 4 (four) hours as needed. 11/26/21  Yes Ernie AvenaLawsing, Charlott Calvario, MD  ibuprofen (ADVIL) 800 MG tablet Take 1 tablet (800 mg total) by mouth 3 (three) times daily. 11/26/21  Yes Ernie AvenaLawsing, Andrian Urbach, MD  tamsulosin (FLOMAX) 0.4 MG CAPS capsule Take 1 capsule (0.4 mg total) by mouth daily for 5 days. 11/26/21 12/01/21 Yes Ernie AvenaLawsing, Kala Gassmann, MD  ondansetron (ZOFRAN-ODT) 8 MG disintegrating tablet Take 1 tablet (8 mg total) by mouth every 8 (eight) hours as needed for nausea or vomiting. 09/05/21   Lorre NickAllen, Anthony, MD  oseltamivir (TAMIFLU) 75 MG capsule Take 1 capsule (75 mg total) by mouth every 12 (twelve) hours. 05/19/21   Henderly, Britni A, PA-C  oxyCODONE-acetaminophen (PERCOCET/ROXICET) 5-325 MG tablet Take 1-2 tablets by mouth every 6 (six) hours as  needed for severe pain. 09/05/21   Lorre NickAllen, Anthony, MD      Allergies    Patient has no known allergies.    Review of Systems   Review of Systems  Genitourinary:  Positive for flank pain.  All other systems reviewed and are negative.  Physical Exam Updated Vital Signs BP 138/61   Pulse 76   Temp 98 F (36.7 C) (Oral)   Resp 17   Ht 6\' 5"  (1.956 m)   Wt (!) 147.4 kg   SpO2 100%   BMI 38.54 kg/m  Physical Exam Vitals and nursing note reviewed.  Constitutional:      General: He is not in acute distress.    Appearance: He is well-developed.  HENT:     Head: Normocephalic and atraumatic.  Eyes:     Conjunctiva/sclera: Conjunctivae normal.  Cardiovascular:     Rate and Rhythm: Normal rate and regular rhythm.  Pulmonary:     Effort: Pulmonary effort is normal. No respiratory distress.  Abdominal:     Palpations: Abdomen is soft.     Tenderness: There is no abdominal tenderness. There is no right CVA tenderness, left CVA tenderness, guarding or rebound.  Musculoskeletal:        General: No swelling.     Cervical back: Neck supple.  Skin:    General: Skin is warm and dry.     Capillary Refill: Capillary refill takes less than 2 seconds.  Neurological:     Mental Status: He is alert.  Psychiatric:  Mood and Affect: Mood normal.    ED Results / Procedures / Treatments   Labs (all labs ordered are listed, but only abnormal results are displayed) Labs Reviewed  URINALYSIS, ROUTINE W REFLEX MICROSCOPIC - Abnormal; Notable for the following components:      Result Value   Hgb urine dipstick MODERATE (*)    Ketones, ur 15 (*)    Protein, ur 30 (*)    All other components within normal limits  CBC - Abnormal; Notable for the following components:   WBC 13.6 (*)    All other components within normal limits  BASIC METABOLIC PANEL - Abnormal; Notable for the following components:   CO2 21 (*)    Glucose, Bld 139 (*)    Creatinine, Ser 1.45 (*)    GFR, Estimated 55  (*)    All other components within normal limits  LIPASE, BLOOD    EKG None  Radiology CT Renal Stone Study  Result Date: 11/26/2021 CLINICAL DATA:  Right flank pain starting this morning EXAM: CT ABDOMEN AND PELVIS WITHOUT CONTRAST TECHNIQUE: Multidetector CT imaging of the abdomen and pelvis was performed following the standard protocol without IV contrast. RADIATION DOSE REDUCTION: This exam was performed according to the departmental dose-optimization program which includes automated exposure control, adjustment of the mA and/or kV according to patient size and/or use of iterative reconstruction technique. COMPARISON:  09/05/2021 FINDINGS: Lower chest: Mild descending thoracic aortic atherosclerotic calcification. Hepatobiliary: 6 mm gallstone dependently in the gallbladder on image 28 series 2. Otherwise unremarkable. Pancreas: Unremarkable Spleen: Unremarkable Adrenals/Urinary Tract: New right perirenal stranding with mild right hydronephrosis and moderate right hydroureter extending down to a cluster of 2 linear calcifications at the right UVJ. The more distal calcification measures 4 mm by 2 mm on image 54 series 5 and the slightly more proximal calcification measures 4 mm by 2 mm as well. There are 4 nonobstructive left renal calculi in the 1-2 mm diameter range. No left hydronephrosis or hydroureter. Renal glands unremarkable.  Urinary bladder otherwise unremarkable. Stomach/Bowel: Mild sigmoid colon diverticulosis. Vascular/Lymphatic: Atherosclerosis is present, including mild aortoiliac atherosclerotic disease. Reproductive: Unremarkable Other: No supplemental non-categorized findings. Musculoskeletal: Small supraumbilical hernias contain adipose tissue on image 77 series 6. Lower lumbar spondylosis and degenerative disc disease with bilateral foraminal impingement at L5-S1 and right foraminal impingement at L4-5. IMPRESSION: 1. Right hydronephrosis and hydroureter due to a cluster of two  stones at the right UVJ. Each of these stones measures about 0.4 by 0.2 cm. 2. Four nonobstructive left renal calculi are observed. 3. Mild sigmoid colon diverticulosis. 4.  Aortic Atherosclerosis (ICD10-I70.0). 5. Small supraumbilical hernias contain adipose tissue. 6. Lower lumbar impingement. 7. Cholelithiasis. Electronically Signed   By: Gaylyn Rong M.D.   On: 11/26/2021 15:29    Procedures Procedures    Medications Ordered in ED Medications  ondansetron (ZOFRAN) 4 MG/2ML injection (has no administration in time range)  fentaNYL (SUBLIMAZE) injection 50 mcg (50 mcg Intravenous Given 11/26/21 1614)  ondansetron (ZOFRAN) injection 4 mg (4 mg Intravenous Given 11/26/21 1511)  ketorolac (TORADOL) 15 MG/ML injection 15 mg (15 mg Intravenous Given 11/26/21 1613)  lactated ringers bolus 1,000 mL (0 mLs Intravenous Stopped 11/26/21 1716)    ED Course/ Medical Decision Making/ A&P                           Medical Decision Making Amount and/or Complexity of Data Reviewed Labs: ordered. Radiology: ordered.  Risk Prescription  drug management.    61 year old male with a history of kidney stones presenting to the emergency department with right-sided flank and right lower quadrant abdominal pain.  The patient was seen in the emergency department yesterday for the same.  He was initially discharged after feeling symptomatically improved but presents with return of his pain.  He endorses no chills or fever.  He was somewhat diaphoretic.  He states that he was rolling around on the ground earlier today for around 2 hours before he made the decision to come to the emergency department.  He has not yet followed up outpatient with urology.  He has not taken anything for the pain.  On my initial evaluation, the patient symptomatically had complete resolution of his pain after Toradol.  On arrival, the patient was vitally stable, mildly hypertensive BP 154/84, NSR noted on cardiac telemetry.  Physical  exam generally unremarkable after the patient was administered IV Toradol and an IV fluid bolus.  Patient is also status post IV fentanyl and Zofran although he states that these medications did not relieve his symptoms.  CT abdomen pelvis was performed which revealed the following: IMPRESSION:  1. Right hydronephrosis and hydroureter due to a cluster of two  stones at the right UVJ. Each of these stones measures about 0.4 by  0.2 cm.  2. Four nonobstructive left renal calculi are observed.  3. Mild sigmoid colon diverticulosis.  4.  Aortic Atherosclerosis (ICD10-I70.0).  5. Small supraumbilical hernias contain adipose tissue.  6. Lower lumbar impingement.  7. Cholelithiasis.       Urinalysis was performed, interpreted by myself negative for urinary tract infection, the patient does have leukocytosis to 13.6 but does not make SIRS criteria with low concern for sepsis at this time.  He does have evidence of an AKI with a creatinine of 1.45 however given his resolution of symptoms, ability to tolerate oral intake, I did recommend discharge with close outpatient urology follow-up.  Advised NSAIDs for pain control and Flomax.  Final Clinical Impression(s) / ED Diagnoses Final diagnoses:  Kidney stones  AKI (acute kidney injury) [facility])    Rx / DC Orders ED Discharge Orders          Ordered    Ambulatory referral to Urology        11/26/21 1702    tamsulosin (FLOMAX) 0.4 MG CAPS capsule  Daily        11/26/21 1703    HYDROcodone-acetaminophen (NORCO/VICODIN) 5-325 MG tablet  Every 4 hours PRN        11/26/21 1703    ibuprofen (ADVIL) 800 MG tablet  3 times daily        11/26/21 1703              Ernie Avena, MD 11/26/21 1803

## 2021-11-26 NOTE — ED Triage Notes (Signed)
Pt endorses right sided flank pain since this morning. Hx of kidney stones. Pt diaphoretic.

## 2022-06-17 ENCOUNTER — Emergency Department (HOSPITAL_COMMUNITY)
Admission: EM | Admit: 2022-06-17 | Discharge: 2022-06-17 | Disposition: A | Discharge status: Home / Self Care | Payer: Self-pay | Attending: Emergency Medicine | Admitting: Emergency Medicine

## 2022-06-17 ENCOUNTER — Emergency Department (HOSPITAL_COMMUNITY): Payer: Self-pay

## 2022-06-17 ENCOUNTER — Encounter (HOSPITAL_COMMUNITY): Payer: Self-pay

## 2022-06-17 ENCOUNTER — Other Ambulatory Visit: Payer: Self-pay

## 2022-06-17 DIAGNOSIS — R002 Palpitations: Secondary | ICD-10-CM | POA: Insufficient documentation

## 2022-06-17 DIAGNOSIS — R519 Headache, unspecified: Secondary | ICD-10-CM | POA: Insufficient documentation

## 2022-06-17 LAB — CBC WITH DIFFERENTIAL/PLATELET
Abs Immature Granulocytes: 0.04 10*3/uL (ref 0.00–0.07)
Basophils Absolute: 0.1 10*3/uL (ref 0.0–0.1)
Basophils Relative: 1 %
Eosinophils Absolute: 0.1 10*3/uL (ref 0.0–0.5)
Eosinophils Relative: 1 %
HCT: 47.7 % (ref 39.0–52.0)
Hemoglobin: 15 g/dL (ref 13.0–17.0)
Immature Granulocytes: 1 %
Lymphocytes Relative: 19 %
Lymphs Abs: 1.6 10*3/uL (ref 0.7–4.0)
MCH: 30.1 pg (ref 26.0–34.0)
MCHC: 31.4 g/dL (ref 30.0–36.0)
MCV: 95.6 fL (ref 80.0–100.0)
Monocytes Absolute: 1.2 10*3/uL — ABNORMAL HIGH (ref 0.1–1.0)
Monocytes Relative: 14 %
Neutro Abs: 5.4 10*3/uL (ref 1.7–7.7)
Neutrophils Relative %: 64 %
Platelets: 192 10*3/uL (ref 150–400)
RBC: 4.99 MIL/uL (ref 4.22–5.81)
RDW: 13.1 % (ref 11.5–15.5)
WBC: 8.4 10*3/uL (ref 4.0–10.5)
nRBC: 0 % (ref 0.0–0.2)

## 2022-06-17 LAB — COMPREHENSIVE METABOLIC PANEL
ALT: 24 U/L (ref 0–44)
AST: 28 U/L (ref 15–41)
Albumin: 4.2 g/dL (ref 3.5–5.0)
Alkaline Phosphatase: 70 U/L (ref 38–126)
Anion gap: 10 (ref 5–15)
BUN: 10 mg/dL (ref 8–23)
CO2: 22 mmol/L (ref 22–32)
Calcium: 9 mg/dL (ref 8.9–10.3)
Chloride: 105 mmol/L (ref 98–111)
Creatinine, Ser: 1.08 mg/dL (ref 0.61–1.24)
GFR, Estimated: >60 mL/min (ref >60)
Glucose, Bld: 124 mg/dL — ABNORMAL HIGH (ref 70–99)
Potassium: 4.1 mmol/L (ref 3.5–5.1)
Sodium: 137 mmol/L (ref 135–145)
Total Bilirubin: 0.5 mg/dL (ref 0.3–1.2)
Total Protein: 7.1 g/dL (ref 6.5–8.1)

## 2022-06-17 LAB — TROPONIN I (HIGH SENSITIVITY)
Troponin I (High Sensitivity): 3 ng/L (ref <18)
Troponin I (High Sensitivity): 3 ng/L (ref <18)

## 2022-06-17 MED ORDER — SODIUM CHLORIDE 0.9 % IV BOLUS
1000.0000 mL | Freq: Once | INTRAVENOUS | Status: AC
  Administered 2022-06-17: 1000 mL via INTRAVENOUS

## 2022-06-17 MED ORDER — KETOROLAC TROMETHAMINE 15 MG/ML IJ SOLN
15.0000 mg | Freq: Once | INTRAMUSCULAR | Status: AC
  Administered 2022-06-17: 15 mg via INTRAVENOUS
  Filled 2022-06-17: qty 1

## 2022-06-17 MED ORDER — DEXAMETHASONE SODIUM PHOSPHATE 10 MG/ML IJ SOLN
10.0000 mg | Freq: Once | INTRAMUSCULAR | Status: AC
  Administered 2022-06-17: 10 mg via INTRAVENOUS
  Filled 2022-06-17: qty 1

## 2022-06-17 MED ORDER — METOCLOPRAMIDE HCL 5 MG/ML IJ SOLN
10.0000 mg | Freq: Once | INTRAMUSCULAR | Status: AC
  Administered 2022-06-17: 10 mg via INTRAVENOUS
  Filled 2022-06-17: qty 2

## 2022-06-17 NOTE — ED Provider Notes (Signed)
[facility] Provider Note   CSN: [id number] Arrival date & time: 06/17/22  [id number]     History  Chief Complaint  Patient presents with   Palpitations    Charles Werner is a 61 y.o. male.   Palpitations   61 year old male presents emergency department with complaints of headache, dizziness, palpitations. Patient reports 2 to 3-day history of worsening headache. Described as occipital in nature radiating down neck into trapezius region. States it began suddenly and increase in intensity since onset. Has tried at home Goody powder which has not helped. Also reports some feelings of dizziness described as lightheadedness. Denies room spinning type sensation. Reports sensation is worsened with positional changes. Denies difficulty speaking, gait abnormalities, difficulty swallowing, double vision/blurry vision. Reports some intermittent palpitations without overt chest pain or shortness of breath. Denies fever, chills, night sweats, cough, congestion, nausea, vomiting, change in bowel habits.   No significant pertinent past medical history.  Home Medications Prior to Admission medications   Medication Sig Start Date End Date Taking? Authorizing Provider  HYDROcodone-acetaminophen (NORCO/VICODIN) 5-325 MG tablet Take 2 tablets by mouth every 4 (four) hours as needed. 11/26/21   Ernie AvenaLawsing, James, MD  ibuprofen (ADVIL) 800 MG tablet Take 1 tablet (800 mg total) by mouth 3 (three) times daily. 11/26/21   Ernie AvenaLawsing, James, MD  ondansetron (ZOFRAN-ODT) 8 MG disintegrating tablet Take 1 tablet (8 mg total) by mouth every 8 (eight) hours as needed for nausea or vomiting. 09/05/21   Lorre NickAllen, Anthony, MD  oseltamivir (TAMIFLU) 75 MG capsule Take 1 capsule (75 mg total) by mouth every 12 (twelve) hours. 05/19/21   Henderly, Britni A, PA-C  oxyCODONE-acetaminophen (PERCOCET/ROXICET) 5-325 MG tablet Take 1-2 tablets by mouth every 6 (six) hours as needed for severe pain. 09/05/21    Lorre NickAllen, Anthony, MD      Allergies    Patient has no known allergies.    Review of Systems   Review of Systems  Cardiovascular:  Positive for palpitations.  All other systems reviewed and are negative.   Physical Exam Updated Vital Signs BP 120/66   Pulse 81   Temp 99.5 F (37.5 C) (Oral)   Resp (!) 23   Ht 6\' 5"  (1.956 m)   Wt (!) 149.7 kg   SpO2 96%   BMI 39.13 kg/m  Physical Exam Vitals and nursing note reviewed.  Constitutional:      General: He is not in acute distress.    Appearance: He is well-developed.  HENT:     Head: Normocephalic and atraumatic.  Eyes:     Conjunctiva/sclera: Conjunctivae normal.  Cardiovascular:     Rate and Rhythm: Normal rate and regular rhythm.     Heart sounds: No murmur heard. Pulmonary:     Effort: Pulmonary effort is normal. No respiratory distress.     Breath sounds: Normal breath sounds.  Abdominal:     Palpations: Abdomen is soft.     Tenderness: There is no abdominal tenderness.  Musculoskeletal:        General: No swelling.     Cervical back: Neck supple.     Comments: Patient has mild tenderness to palpation left-sided paraspinal in the cervical region with extension down to the left trapezial ridge.  Skin:    General: Skin is warm and dry.     Capillary Refill: Capillary refill takes less than 2 seconds.  Neurological:     Mental Status: He is alert.     Comments: Alert and oriented  to self, place, time and event.   Speech is fluent, clear without dysarthria or dysphasia.   Strength 5/5 in upper/lower extremities   Sensation intact in upper/lower extremities   Normal gait.  Negative Romberg. No pronator drift.  Normal finger-to-nose and feet tapping.  CN I not tested  CN II grossly intact visual fields bilaterally. Did not visualize posterior eye.  CN III, IV, VI PERRLA and EOMs intact bilaterally  CN V Intact sensation to sharp and light touch to the face  CN VII facial movements symmetric  CN VIII not  tested  CN IX, X no uvula deviation, symmetric rise of soft palate  CN XI 5/5 SCM and trapezius strength bilaterally  CN XII Midline tongue protrusion, symmetric L/R movements   Psychiatric:        Mood and Affect: Mood normal.     ED Results / Procedures / Treatments   Labs (all labs ordered are listed, but only abnormal results are displayed) Labs Reviewed  COMPREHENSIVE METABOLIC PANEL - Abnormal; Notable for the following components:      Result Value   Glucose, Bld 124 (*)    All other components within normal limits  CBC WITH DIFFERENTIAL/PLATELET - Abnormal; Notable for the following components:   Monocytes Absolute 1.2 (*)    All other components within normal limits  TROPONIN I (HIGH SENSITIVITY)  TROPONIN I (HIGH SENSITIVITY)    EKG None  Radiology CT Head Wo Contrast  Result Date: 06/17/2022 CLINICAL DATA:  Severe headache and dizziness for 2 days. EXAM: CT HEAD WITHOUT CONTRAST TECHNIQUE: Contiguous axial images were obtained from the base of the skull through the vertex without intravenous contrast. RADIATION DOSE REDUCTION: This exam was performed according to the departmental dose-optimization program which includes automated exposure control, adjustment of the mA and/or kV according to patient size and/or use of iterative reconstruction technique. COMPARISON:  None Available. FINDINGS: Brain: No evidence of intracranial hemorrhage, acute infarction, hydrocephalus, extra-axial collection, or mass lesion/mass effect. Vascular:  No hyperdense vessel or other acute findings. Skull: No evidence of fracture or other significant bone abnormality. Sinuses/Orbits:  No acute findings. Other: None. IMPRESSION: Negative noncontrast head CT. Electronically Signed   By: Danae Orleans M.D.   On: 06/17/2022 09:02   DG Chest 2 View  Result Date: 06/17/2022 CLINICAL DATA:  Chest pain EXAM: CHEST - 2 VIEW COMPARISON:  Radiograph 05/18/2021 FINDINGS: Unchanged cardiomediastinal  silhouette. There is no focal airspace consolidation. There is no pleural effusion or evidence of pneumothorax. There is no acute osseous abnormality. IMPRESSION: No evidence of acute cardiopulmonary disease. Electronically Signed   By: Caprice Renshaw M.D.   On: 06/17/2022 08:57    Procedures Procedures    Medications Ordered in ED Medications  sodium chloride 0.9 % bolus 1,000 mL (0 mLs Intravenous Stopped 06/17/22 0943)  metoCLOPramide (REGLAN) injection 10 mg (10 mg Intravenous Given 06/17/22 3704)  ketorolac (TORADOL) 15 MG/ML injection 15 mg (15 mg Intravenous Given 06/17/22 0822)  dexamethasone (DECADRON) injection 10 mg (10 mg Intravenous Given 06/17/22 8889)    ED Course/ Medical Decision Making/ A&P Clinical Course as of 06/17/22 1023  Mon Jun 17, 2022  1015 Reassessment of the patient showed resolution of headache as well as feelings of dizziness.  Patient currently not complaining of palpitations.  Discussed with patient about obtaining further laboratory studies via TSH... But patient declined at this time and preference for outpatient workup of symptoms via primary care.  Will place cardiology referral. [CR]  Clinical Course User Index [CR] Peter Garter, PA                           Medical Decision Making Amount and/or Complexity of Data Reviewed Labs: ordered. Radiology: ordered.  Risk Prescription drug management.   This patient presents to the ED for concern of palpitaitons/headache, this involves an extensive number of treatment options, and is a complaint that carries with it a high risk of complications and morbidity.  The differential diagnosis includes CVA, cerebral venous thrombosis, electrolyte abnormalities, malignancy, pseudotumor cerebri, MS, tension, cluster, migraine type headache, atrial fibrillation, atrial flutter, SVT, other arrhythmia, ACS, hyperthyroidism, pulmonary embolism   Co morbidities that complicate the patient evaluation  See  HPI   Additional history obtained:  Additional history obtained from EMR External records from outside source obtained and reviewed including hospital records   Lab Tests:  I Ordered, and personally interpreted labs.  The pertinent results include: No leukocytosis.  No evidence of anemia.  Platelets within normal range.  No electrolyte abnormalities appreciated.  Renal function within normal limits.  Troponin of less than 2 with repeat 3;   Imaging Studies ordered:  I ordered imaging studies including CT head, chest x-ray I independently visualized and interpreted imaging which showed  CT head: No acute intracranial abnormality Chest x-ray: No acute cardiopulmonary abnormality I agree with the radiologist interpretation   Cardiac Monitoring: / EKG:  The patient was maintained on a cardiac monitor.  I personally viewed and interpreted the cardiac monitored which showed an underlying rhythm of: Sinus rhythm with nonspecific T wave changes.   Consultations Obtained:  N/a   Problem List / ED Course / Critical interventions / Medication management  Palpitations/headache I ordered medication including 1 L normal saline, Decadron, Toradol, Reglan for migraine cocktail   Reevaluation of the patient after these medicines showed that the patient resolved I have reviewed the patients home medicines and have made adjustments as needed   Social Determinants of Health:  Denies tobacco, illicit drug use   Test / Admission - Considered:  Headache/palpitations Vitals signs  within normal range and stable throughout visit. Laboratory/imaging studies significant for: See above Patient's headache likely secondary to tension type headache given distribution of headache.  Completely resolution of headache as well as feelings of dizziness with administration of migraine cocktail.  Patient with vague complaints of palpitations of which is not currently experiencing.  Discussed with patient  about further workup given overall reassuring EKG, chest x-ray and delta negative troponin the patient declined at this time for outpatient workup of symptoms via primary care.  Patient given cardiology referral for experience palpitations.  Treatment plan discussed at length with patient and he acknowledged understanding was agreeable to said plan. Worrisome signs and symptoms were discussed with the patient, and the patient acknowledged understanding to return to the ED if noticed. Patient was stable upon discharge.          Final Clinical Impression(s) / ED Diagnoses Final diagnoses:  Palpitations  Acute nonintractable headache, unspecified headache type    Rx / DC Orders ED Discharge Orders          Ordered    Ambulatory referral to Cardiology       Comments: If you have not heard from the Cardiology office within the next 72 hours please call [phone].   06/17/22 1018              Sherian Maroon  A, PA 06/17/22 1023    Alvira Monday, MD 06/18/22 0003

## 2022-06-17 NOTE — Discharge Instructions (Signed)
Note the visit emergency department today was overall reassuring.  As discussed, recommend follow-up with cardiology regarding your symptoms of palpitations or fast heart rate.  Recommend adjusting your caffeine consumption to see if there is an association.  Recommend follow-up with primary care regarding headache and neurology if becomes a recurrent issue.  Please do not hesitate to return to emergency department if the worrisome signs and symptoms we discussed become apparent.

## 2022-06-17 NOTE — ED Provider Triage Note (Signed)
Emergency Medicine Provider Triage Evaluation Note  Charles Werner , a 61 y.o. male  was evaluated in triage.  Pt complains of headache, dizziness, palpitations.  Patient reports 2 to 3-day history of worsening headache.  Described as occipital in nature radiating down neck into trapezius region.  States it began suddenly and increase in intensity since onset.  Has tried at home Goody powder which has not helped.  Also reports some feelings of dizziness described as lightheadedness.  Denies room spinning type sensation.  Reports sensation is worsened with positional changes.  Denies difficulty speaking, gait abnormalities, difficulty swallowing, double vision/blurry vision.  Reports some intermittent palpitations without overt chest pain or shortness of breath.  Denies fever, chills, night sweats, cough, congestion, nausea, vomiting, change in bowel habits.  Review of Systems  Positive: See above Negative:   Physical Exam  BP (!) 145/90 (BP Location: Left Arm)   Pulse (!) 104   Temp 99.5 F (37.5 C) (Oral)   Resp 18   SpO2 97%  Gen:   Awake, no distress   Resp:  Normal effort  MSK:   Moves extremities without difficulty  Other:  Cranial nerves II through XII grossly intact.  Patient able to ambulate without difficulty.  Medical Decision Making  Medically screening exam initiated at 7:54 AM.  Appropriate orders placed.  Charles Werner was informed that the remainder of the evaluation will be completed by another provider, this initial triage assessment does not replace that evaluation, and the importance of remaining in the ED until their evaluation is complete.     Peter Garter, [place] 06/17/22 [phone]

## 2022-06-17 NOTE — ED Triage Notes (Signed)
Patient states he has been having heart palpitations, headache, and dizziness. Patient states he has had intermittent heart palpitations approx 1 year, but has had the palpitations, headache, and dizziness x 2 days. Patient states worse when he is lying down.

## 2022-06-17 NOTE — ED Notes (Signed)
Called lab and they stated the troponin and CMP is still running.

## 2022-07-25 IMAGING — CT CT RENAL STONE PROTOCOL
2 of 4 series · 16 of 46 positions shown, 18 images · non-contrast
Comparison: None.

CLINICAL DATA: Left flank left lower quadrant pain.  Nausea.



[Series 2: axial st · axial · 0.93mm/px · z∈[+1125,+1600]mm · 13 of 109 slices shown, 15 images]
[im 7/109  soft-tissue]
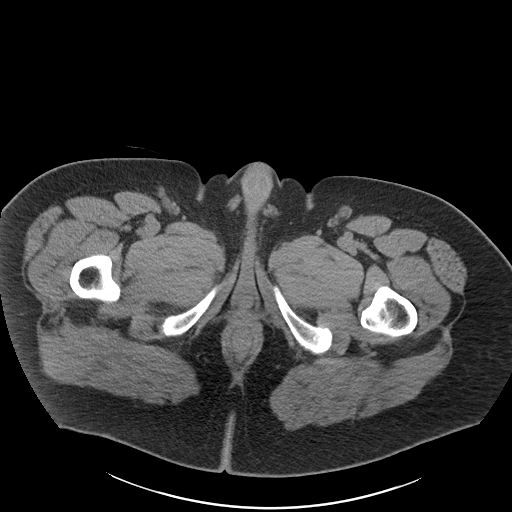
[im 7/109  bone]
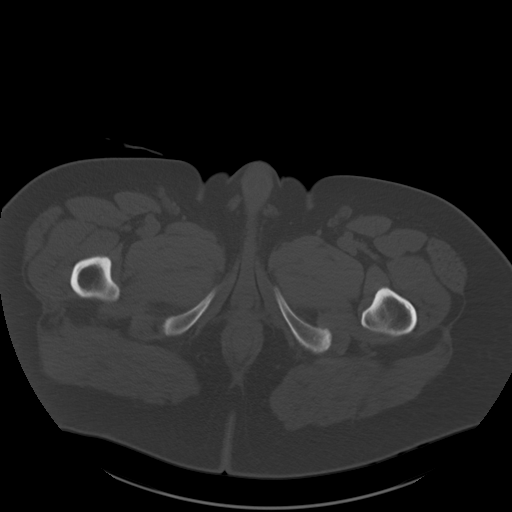
[im 13/109  soft-tissue]
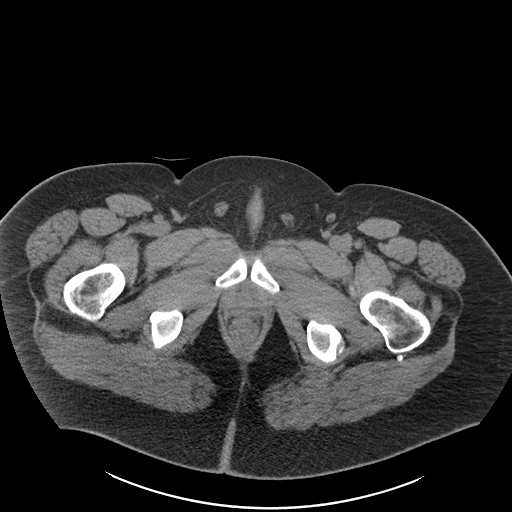
[im 26/109  soft-tissue]
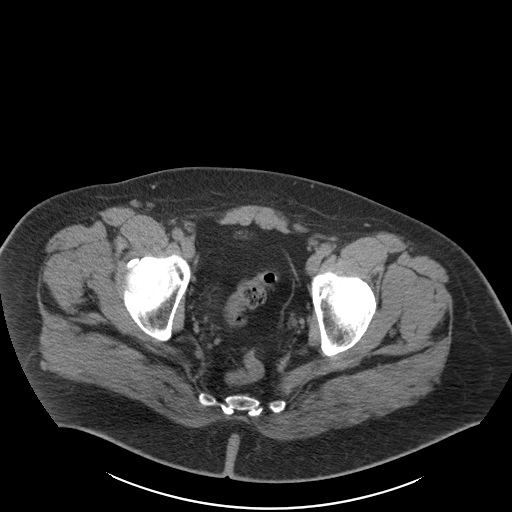
[im 32/109  soft-tissue]
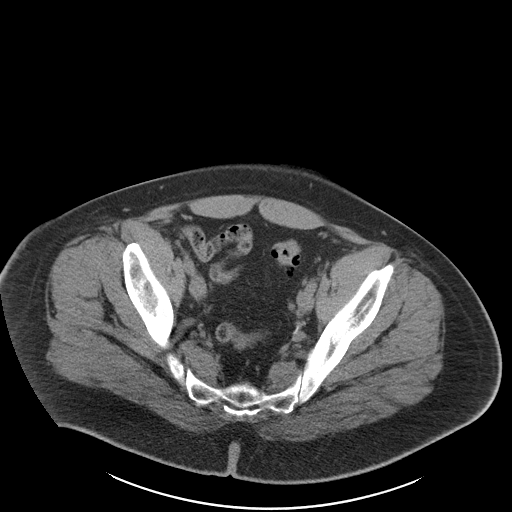
[im 39/109  soft-tissue]
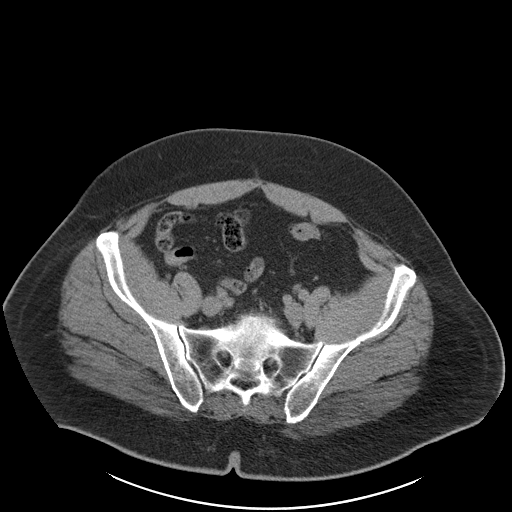
[im 45/109  soft-tissue]
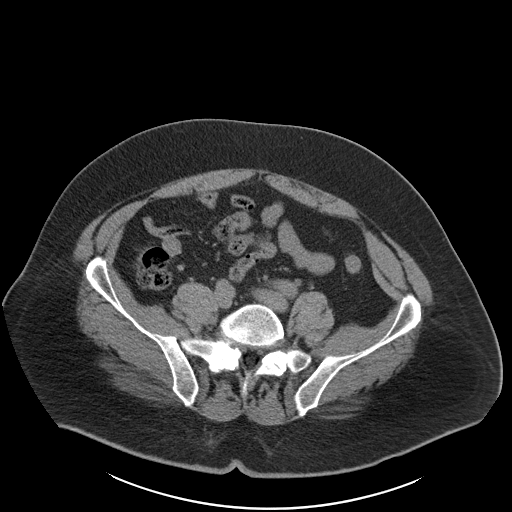
[im 58/109  soft-tissue]
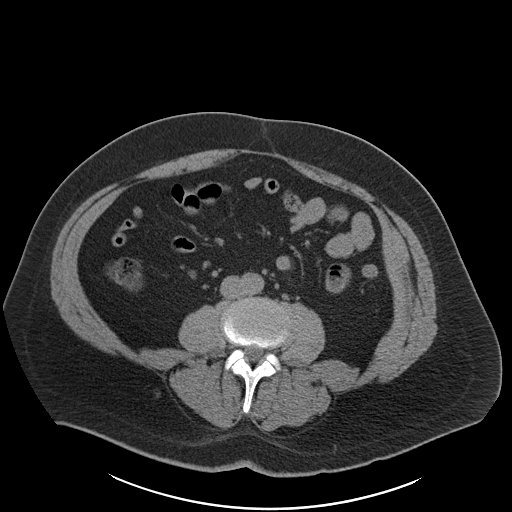
[im 64/109  soft-tissue]
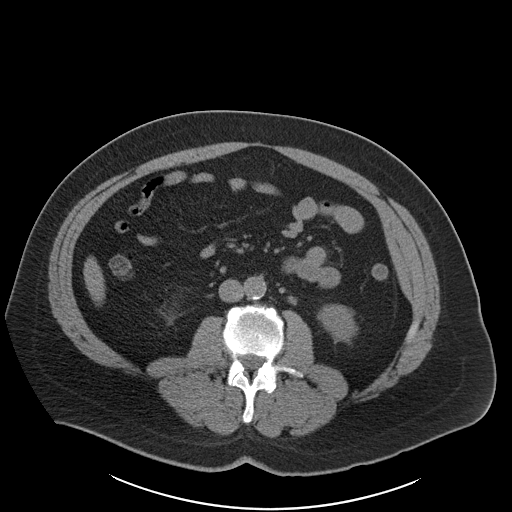
[im 70/109  soft-tissue]
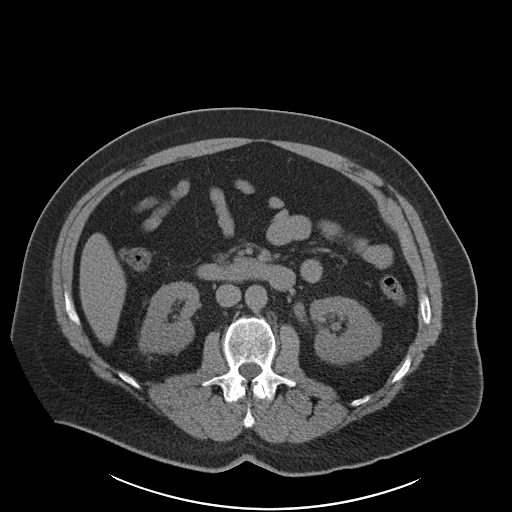
[im 70/109  bone]
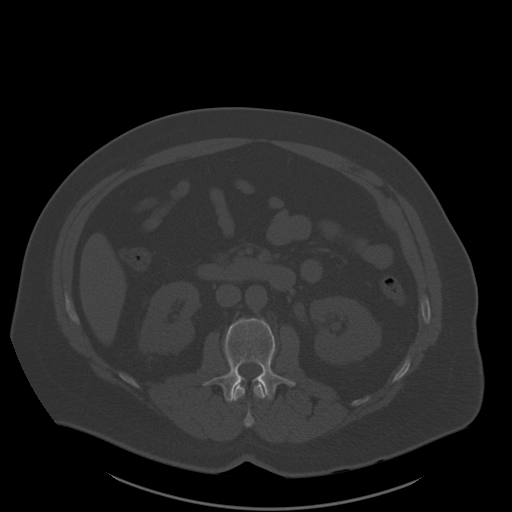
[im 77/109  soft-tissue]
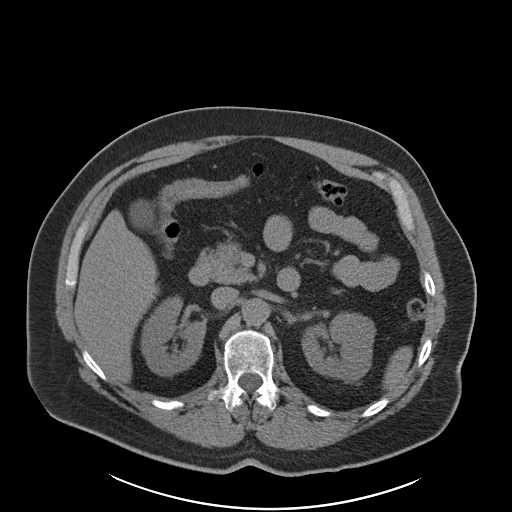
[im 83/109  soft-tissue]
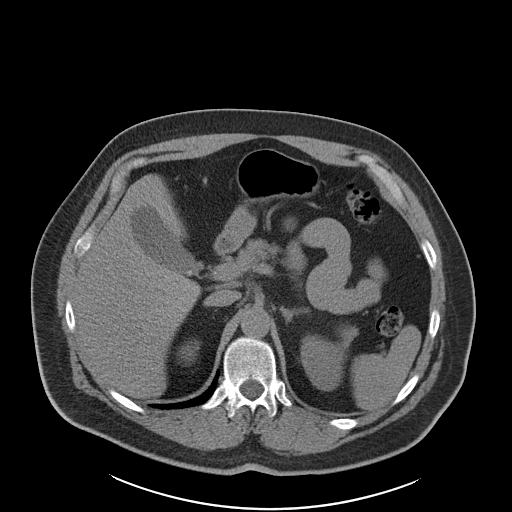
[im 96/109  soft-tissue]
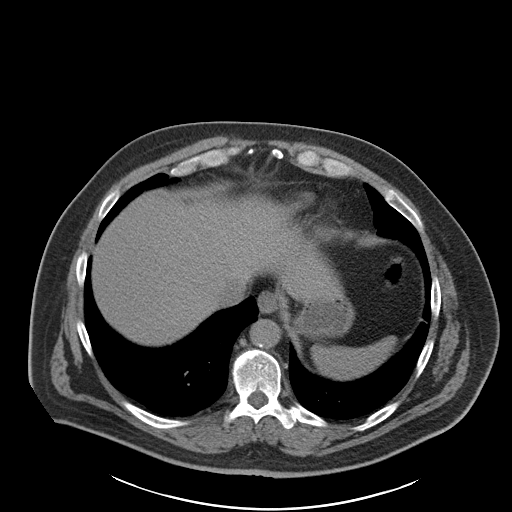
[im 102/109  soft-tissue]
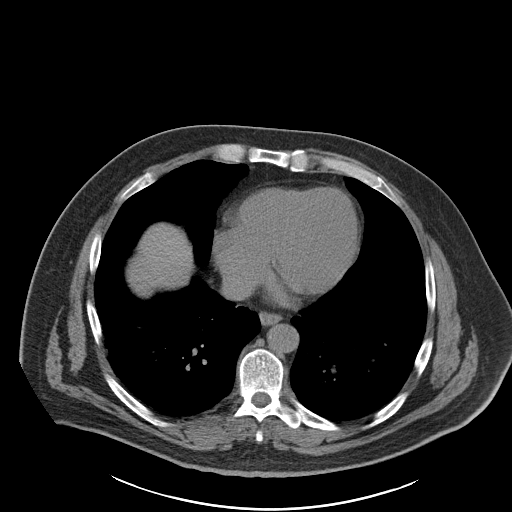

[Series 5: coronal · coronal · 1.01mm/px · 3 of 141 slices shown]
[im 47/141  soft-tissue]
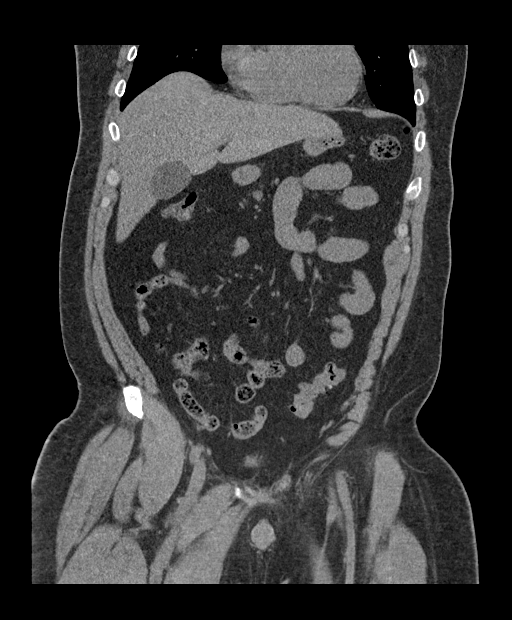
[im 63/141  soft-tissue]
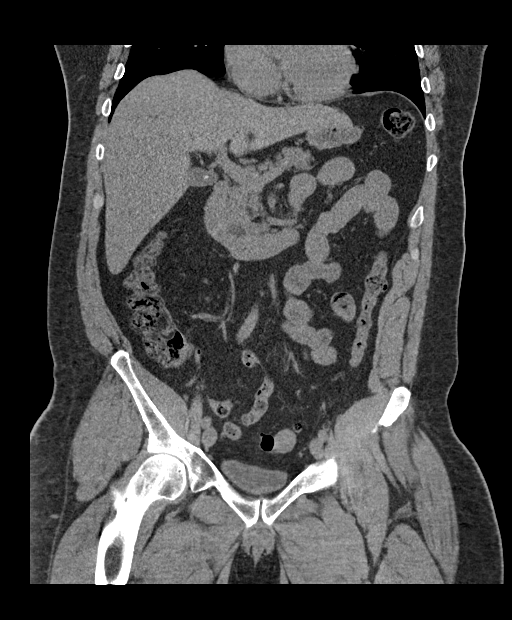
[im 78/141  soft-tissue]
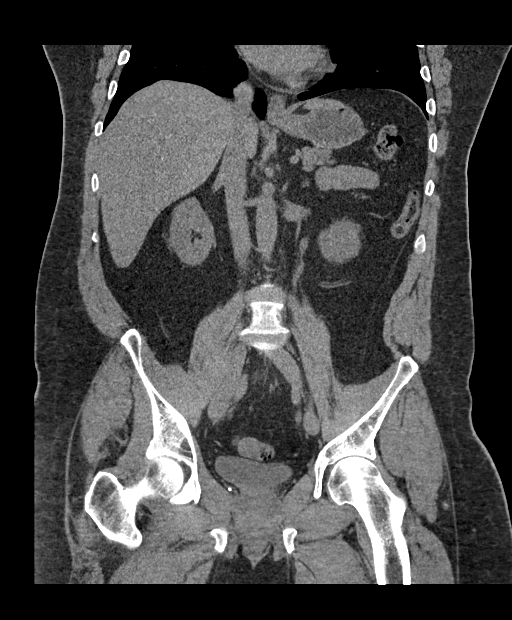

[16 of 46 positions shown; findings below may reference images not displayed]

FINDINGS: Lower chest: Minimal dependent atelectasis bilaterally. Tiny
peripheral left lower lobe nodules measure up to 3 mm ([DATE]). Heart
size normal. No pericardial or pleural effusion. Distal esophagus is
unremarkable.

Hepatobiliary: Liver is enlarged, 21.5 cm. Liver is otherwise
unremarkable. Stone in the gallbladder. No biliary ductal
dilatation.

Pancreas: Negative.

Spleen: Negative.

Adrenals/Urinary Tract: Adrenal glands are unremarkable. Punctate
stones in the kidneys bilaterally, right ureter is decompressed.
Mild left hydronephrosis secondary to a 4 mm stone at the left
ureterovesical junction. Bladder is low in volume.

Stomach/Bowel: Stomach, small bowel, appendix and colon are
unremarkable.

Vascular/Lymphatic: Atherosclerotic calcification of the aorta. No
pathologically enlarged lymph nodes.

Reproductive: Prostate is normal in size.

Other: No free fluid.  Mesenteries and peritoneum are unremarkable.

Musculoskeletal: No worrisome lytic or sclerotic lesions.
IMPRESSION: 1. Mild left hydronephrosis secondary to a 4 mm stone at the left
ureterovesical junction.
2. Punctate bilateral renal stones.
3. Hepatomegaly.
4. Cholelithiasis.
5. Tiny peripheral left lower lobe nodules measure up to 3 mm. No
follow-up needed if patient is low-risk (and has no known or
suspected primary neoplasm). Non-contrast chest CT can be considered
in 12 months if patient is high-risk. This recommendation follows
the consensus statement: Guidelines for Management of Incidental
Pulmonary Nodules Detected on CT Images: From the [HOSPITAL]

## 2023-04-21 ENCOUNTER — Other Ambulatory Visit (HOSPITAL_COMMUNITY): Payer: Self-pay
